# Patient Record
Sex: Female | Born: 1955 | Race: Black or African American | Hispanic: No | Marital: Single
Health system: Southern US, Community
[De-identification: ages and names within clinical notes are randomized; demographics above are authoritative.]

---

## 2012-09-25 ENCOUNTER — Emergency Department: Payer: Self-pay | Admitting: Emergency Medicine

## 2012-09-26 ENCOUNTER — Emergency Department: Payer: Self-pay | Admitting: Emergency Medicine

## 2012-09-26 LAB — CBC
HCT: 34 % — ABNORMAL LOW (ref 35.0–47.0)
MCH: 27.8 pg (ref 26.0–34.0)
MCHC: 32.9 g/dL (ref 32.0–36.0)
Platelet: 230 10*3/uL (ref 150–440)

## 2012-09-26 LAB — COMPREHENSIVE METABOLIC PANEL
Alkaline Phosphatase: 55 U/L (ref 50–136)
BUN: 14 mg/dL (ref 7–18)
Bilirubin,Total: 0.3 mg/dL (ref 0.2–1.0)
Chloride: 100 mmol/L (ref 98–107)
Co2: 24 mmol/L (ref 21–32)
Creatinine: 0.81 mg/dL (ref 0.60–1.30)
EGFR (African American): >60
EGFR (Non-African Amer.): >60
Glucose: 105 mg/dL — ABNORMAL HIGH (ref 65–99)
Potassium: 4.8 mmol/L (ref 3.5–5.1)
SGPT (ALT): 17 U/L (ref 12–78)
Sodium: 132 mmol/L — ABNORMAL LOW (ref 136–145)

## 2012-11-03 ENCOUNTER — Emergency Department: Payer: Self-pay | Admitting: Internal Medicine

## 2012-11-03 LAB — URINALYSIS, COMPLETE
Bacteria: NONE SEEN
Blood: NEGATIVE
Ketone: NEGATIVE
Leukocyte Esterase: NEGATIVE
Nitrite: NEGATIVE
RBC,UR: NONE SEEN /HPF (ref 0–5)
Specific Gravity: 1.021 (ref 1.003–1.030)
WBC UR: <1 /HPF (ref 0–5)

## 2012-11-30 ENCOUNTER — Emergency Department: Payer: Self-pay | Admitting: Emergency Medicine

## 2012-11-30 LAB — COMPREHENSIVE METABOLIC PANEL
Albumin: 3.4 g/dL (ref 3.4–5.0)
Alkaline Phosphatase: 53 U/L (ref 50–136)
Bilirubin,Total: 0.2 mg/dL (ref 0.2–1.0)
Calcium, Total: 8.7 mg/dL (ref 8.5–10.1)
Co2: 28 mmol/L (ref 21–32)
EGFR (Non-African Amer.): >60
Potassium: 2.9 mmol/L — ABNORMAL LOW (ref 3.5–5.1)
SGOT(AST): 22 U/L (ref 15–37)
Total Protein: 7.4 g/dL (ref 6.4–8.2)

## 2012-11-30 LAB — CBC
HCT: 30 % — ABNORMAL LOW (ref 35.0–47.0)
MCH: 27.7 pg (ref 26.0–34.0)
MCV: 85 fL (ref 80–100)
Platelet: 127 10*3/uL — ABNORMAL LOW (ref 150–440)
RBC: 3.53 10*6/uL — ABNORMAL LOW (ref 3.80–5.20)
RDW: 14.4 % (ref 11.5–14.5)
WBC: 2 10*3/uL — CL (ref 3.6–11.0)

## 2012-12-05 ENCOUNTER — Emergency Department: Payer: Self-pay | Admitting: Emergency Medicine

## 2013-02-23 ENCOUNTER — Emergency Department: Payer: Self-pay | Admitting: Emergency Medicine

## 2013-05-14 ENCOUNTER — Inpatient Hospital Stay: Payer: Self-pay | Admitting: Internal Medicine

## 2013-05-14 LAB — BASIC METABOLIC PANEL
Anion Gap: 2 — ABNORMAL LOW (ref 7–16)
BUN: 16 mg/dL (ref 7–18)
CHLORIDE: 103 mmol/L (ref 98–107)
Calcium, Total: 9 mg/dL (ref 8.5–10.1)
Co2: 32 mmol/L (ref 21–32)
Creatinine: 0.55 mg/dL — ABNORMAL LOW (ref 0.60–1.30)
EGFR (African American): >60
Glucose: 87 mg/dL (ref 65–99)
Osmolality: 274 (ref 275–301)
POTASSIUM: 4.1 mmol/L (ref 3.5–5.1)
SODIUM: 137 mmol/L (ref 136–145)

## 2013-05-14 LAB — D-DIMER(ARMC): D-Dimer: 971 ng/ml

## 2013-05-14 LAB — URINALYSIS, COMPLETE
BLOOD: NEGATIVE
Bacteria: NONE SEEN
Bilirubin,UR: NEGATIVE
GLUCOSE, UR: NEGATIVE mg/dL (ref 0–75)
KETONE: NEGATIVE
NITRITE: NEGATIVE
PH: 6 (ref 4.5–8.0)
PROTEIN: NEGATIVE
RBC,UR: NONE SEEN /HPF (ref 0–5)
Specific Gravity: 1.019 (ref 1.003–1.030)
Squamous Epithelial: 2
WBC UR: 4 /HPF (ref 0–5)

## 2013-05-14 LAB — CBC WITH DIFFERENTIAL/PLATELET
BASOS ABS: 0 10*3/uL (ref 0.0–0.1)
Basophil %: 0.2 %
EOS ABS: 0.1 10*3/uL (ref 0.0–0.7)
Eosinophil %: 2.2 %
HCT: 38.4 % (ref 35.0–47.0)
HGB: 11.8 g/dL — AB (ref 12.0–16.0)
LYMPHS ABS: 1 10*3/uL (ref 1.0–3.6)
Lymphocyte %: 20.6 %
MCH: 26.6 pg (ref 26.0–34.0)
MCHC: 30.8 g/dL — ABNORMAL LOW (ref 32.0–36.0)
MCV: 86 fL (ref 80–100)
MONO ABS: 0.5 x10 3/mm (ref 0.2–0.9)
MONOS PCT: 10.7 %
NEUTROS PCT: 66.3 %
Neutrophil #: 3.3 10*3/uL (ref 1.4–6.5)
PLATELETS: 119 10*3/uL — AB (ref 150–440)
RBC: 4.45 10*6/uL (ref 3.80–5.20)
RDW: 14.8 % — ABNORMAL HIGH (ref 11.5–14.5)
WBC: 4.9 10*3/uL (ref 3.6–11.0)

## 2013-05-14 LAB — TROPONIN I
TROPONIN-I: 7.3 ng/mL — AB
TROPONIN-I: 7 ng/mL — AB
Troponin-I: 7.9 ng/mL — ABNORMAL HIGH

## 2013-05-14 LAB — CK-MB
CK-MB: 1.4 ng/mL (ref 0.5–3.6)
CK-MB: 1.2 ng/mL (ref 0.5–3.6)
CK-MB: 1.1 ng/mL (ref 0.5–3.6)

## 2013-05-14 LAB — APTT
Activated PTT: 32.5 secs (ref 23.6–35.9)
Activated PTT: 102.6 secs — ABNORMAL HIGH (ref 23.6–35.9)

## 2013-05-15 LAB — CBC WITH DIFFERENTIAL/PLATELET
BASOS PCT: 0.6 %
Basophil #: 0 10*3/uL (ref 0.0–0.1)
EOS PCT: 3.6 %
Eosinophil #: 0.1 10*3/uL (ref 0.0–0.7)
HCT: 33.8 % — AB (ref 35.0–47.0)
HGB: 10.7 g/dL — ABNORMAL LOW (ref 12.0–16.0)
LYMPHS ABS: 1.3 10*3/uL (ref 1.0–3.6)
LYMPHS PCT: 35.9 %
MCH: 27.2 pg (ref 26.0–34.0)
MCHC: 31.7 g/dL — ABNORMAL LOW (ref 32.0–36.0)
MCV: 86 fL (ref 80–100)
Monocyte #: 0.4 x10 3/mm (ref 0.2–0.9)
Monocyte %: 10.5 %
Neutrophil #: 1.8 10*3/uL (ref 1.4–6.5)
Neutrophil %: 49.4 %
PLATELETS: 118 10*3/uL — AB (ref 150–440)
RBC: 3.94 10*6/uL (ref 3.80–5.20)
RDW: 14.8 % — AB (ref 11.5–14.5)
WBC: 3.7 10*3/uL (ref 3.6–11.0)

## 2013-05-15 LAB — LIPID PANEL
Cholesterol: 135 mg/dL (ref 0–200)
HDL Cholesterol: 56 mg/dL (ref 40–60)
Ldl Cholesterol, Calc: 64 mg/dL (ref 0–100)
Triglycerides: 77 mg/dL (ref 0–200)
VLDL Cholesterol, Calc: 15 mg/dL (ref 5–40)

## 2013-05-15 LAB — BASIC METABOLIC PANEL
Anion Gap: 4 — ABNORMAL LOW (ref 7–16)
BUN: 10 mg/dL (ref 7–18)
CHLORIDE: 105 mmol/L (ref 98–107)
Calcium, Total: 8.8 mg/dL (ref 8.5–10.1)
Co2: 29 mmol/L (ref 21–32)
Creatinine: 0.6 mg/dL (ref 0.60–1.30)
EGFR (Non-African Amer.): >60
GLUCOSE: 83 mg/dL (ref 65–99)
OSMOLALITY: 274 (ref 275–301)
Potassium: 4 mmol/L (ref 3.5–5.1)
SODIUM: 138 mmol/L (ref 136–145)

## 2013-05-15 LAB — APTT: Activated PTT: 104.5 secs — ABNORMAL HIGH (ref 23.6–35.9)

## 2013-05-17 LAB — TROPONIN I: Troponin-I: 2.3 ng/mL — ABNORMAL HIGH

## 2013-05-17 LAB — PRO B NATRIURETIC PEPTIDE: B-Type Natriuretic Peptide: 192 pg/mL — ABNORMAL HIGH (ref 0–125)

## 2013-05-17 LAB — CBC
HCT: 35.8 % (ref 35.0–47.0)
HGB: 11.5 g/dL — ABNORMAL LOW (ref 12.0–16.0)
MCH: 27.6 pg (ref 26.0–34.0)
MCHC: 32.2 g/dL (ref 32.0–36.0)
MCV: 86 fL (ref 80–100)
Platelet: 114 10*3/uL — ABNORMAL LOW (ref 150–440)
RBC: 4.18 10*6/uL (ref 3.80–5.20)
RDW: 14.9 % — AB (ref 11.5–14.5)
WBC: 3.1 10*3/uL — ABNORMAL LOW (ref 3.6–11.0)

## 2013-05-17 LAB — BASIC METABOLIC PANEL
Anion Gap: 3 — ABNORMAL LOW (ref 7–16)
BUN: 9 mg/dL (ref 7–18)
Calcium, Total: 8.8 mg/dL (ref 8.5–10.1)
Chloride: 105 mmol/L (ref 98–107)
Co2: 28 mmol/L (ref 21–32)
Creatinine: 0.61 mg/dL (ref 0.60–1.30)
EGFR (African American): >60
Glucose: 102 mg/dL — ABNORMAL HIGH (ref 65–99)
Osmolality: 271 (ref 275–301)
POTASSIUM: 4.2 mmol/L (ref 3.5–5.1)
SODIUM: 136 mmol/L (ref 136–145)

## 2013-05-17 LAB — PROTIME-INR
INR: 0.9
Prothrombin Time: 12.3 secs (ref 11.5–14.7)

## 2013-05-17 LAB — CK-MB: CK-MB: 0.6 ng/mL (ref 0.5–3.6)

## 2013-05-18 ENCOUNTER — Observation Stay: Payer: Self-pay | Admitting: Internal Medicine

## 2013-05-18 LAB — BASIC METABOLIC PANEL
ANION GAP: 4 — AB (ref 7–16)
BUN: 9 mg/dL (ref 7–18)
Calcium, Total: 8.9 mg/dL (ref 8.5–10.1)
Chloride: 107 mmol/L (ref 98–107)
Co2: 27 mmol/L (ref 21–32)
Creatinine: 0.58 mg/dL — ABNORMAL LOW (ref 0.60–1.30)
EGFR (African American): >60
Glucose: 89 mg/dL (ref 65–99)
Osmolality: 274 (ref 275–301)
POTASSIUM: 4.2 mmol/L (ref 3.5–5.1)
Sodium: 138 mmol/L (ref 136–145)

## 2013-05-18 LAB — CBC WITH DIFFERENTIAL/PLATELET
Basophil #: 0 10*3/uL (ref 0.0–0.1)
Basophil %: 0.5 %
Eosinophil #: 0.1 10*3/uL (ref 0.0–0.7)
Eosinophil %: 2.3 %
HCT: 36.1 % (ref 35.0–47.0)
HGB: 11.5 g/dL — ABNORMAL LOW (ref 12.0–16.0)
LYMPHS ABS: 0.7 10*3/uL — AB (ref 1.0–3.6)
Lymphocyte %: 30.7 %
MCH: 27.2 pg (ref 26.0–34.0)
MCHC: 31.8 g/dL — AB (ref 32.0–36.0)
MCV: 86 fL (ref 80–100)
MONO ABS: 0.3 x10 3/mm (ref 0.2–0.9)
MONOS PCT: 13.5 %
Neutrophil #: 1.2 10*3/uL — ABNORMAL LOW (ref 1.4–6.5)
Neutrophil %: 53 %
PLATELETS: 109 10*3/uL — AB (ref 150–440)
RBC: 4.23 10*6/uL (ref 3.80–5.20)
RDW: 14.8 % — AB (ref 11.5–14.5)
WBC: 2.4 10*3/uL — ABNORMAL LOW (ref 3.6–11.0)

## 2013-05-18 LAB — MAGNESIUM: MAGNESIUM: 1.8 mg/dL

## 2013-05-18 LAB — LIPID PANEL
Cholesterol: 138 mg/dL (ref 0–200)
HDL Cholesterol: 54 mg/dL (ref 40–60)
LDL CHOLESTEROL, CALC: 72 mg/dL (ref 0–100)
Triglycerides: 59 mg/dL (ref 0–200)
VLDL Cholesterol, Calc: 12 mg/dL (ref 5–40)

## 2013-05-18 LAB — TROPONIN I
Troponin-I: 2.1 ng/mL — ABNORMAL HIGH
Troponin-I: 1.9 ng/mL — ABNORMAL HIGH

## 2013-05-18 LAB — CK-MB
CK-MB: 0.6 ng/mL (ref 0.5–3.6)
CK-MB: 0.9 ng/mL (ref 0.5–3.6)

## 2013-05-19 LAB — CBC WITH DIFFERENTIAL/PLATELET
BASOS PCT: 0.4 %
Basophil #: 0 10*3/uL (ref 0.0–0.1)
EOS ABS: 0.1 10*3/uL (ref 0.0–0.7)
EOS PCT: 2.5 %
HCT: 35.9 % (ref 35.0–47.0)
HGB: 11.4 g/dL — ABNORMAL LOW (ref 12.0–16.0)
LYMPHS ABS: 0.8 10*3/uL — AB (ref 1.0–3.6)
LYMPHS PCT: 29.9 %
MCH: 27.5 pg (ref 26.0–34.0)
MCHC: 31.6 g/dL — ABNORMAL LOW (ref 32.0–36.0)
MCV: 87 fL (ref 80–100)
Monocyte #: 0.3 x10 3/mm (ref 0.2–0.9)
Monocyte %: 12.7 %
NEUTROS PCT: 54.5 %
Neutrophil #: 1.4 10*3/uL (ref 1.4–6.5)
Platelet: 118 10*3/uL — ABNORMAL LOW (ref 150–440)
RBC: 4.14 10*6/uL (ref 3.80–5.20)
RDW: 15.1 % — AB (ref 11.5–14.5)
WBC: 2.6 10*3/uL — AB (ref 3.6–11.0)

## 2013-05-19 LAB — TSH: Thyroid Stimulating Horm: 0.677 u[IU]/mL

## 2014-05-19 NOTE — H&P (Signed)
PATIENT NAME:  Jessica Mccullough, Jessica Mccullough MR#:  [id number] DATE OF BIRTH:  [birth year]  DATE OF ADMISSION:  05/17/2013  PRIMARY CARE PHYSICIAN: Nonlocal.  REFERRING PHYSICIAN: Dr. Rolla PlateMcLauren.  PRIMARY CARDIOLOGIST: Dr. Juliann Paresallwood.   CHIEF COMPLAINT: Chest pain.   HISTORY OF PRESENT ILLNESS: The patient is a 59 year old female who was just admitted to the hospital on April 19 with a chief complaint of chest pain and dizziness, and was discharged on 05/16/2013 with a diagnosis of non-Q wave myocardial infarction, highly suspicious for Prinzmetal's or vasospastic angina. The patient had cardiac catheterization done during this admission, which was apparently normal. The patient was just discharged home on April 21, which was yesterday, but the patient is coming back with chest pain again. The patient is reporting that, at around 9:00 or 9:30 p.m., suddenly she started having left-sided chest pain, which was cramp-like, which was not going away. It lasted for approximately 10 to 15 minutes. This was associated with shortness of breath, dizziness, nausea and vomited once. The patient was diaphoretic at that time. The patient was unable to ambulate at the time because of the severe cramp-like sensation in the chest. The patient crawled on the floor, reached the door, and called her neighbor. Also, 911 was called by the patient. EMS had given her four baby aspirin. After treatment with baby aspirin, the patient's chest cramp is completely resolved. The patient's initial troponin in the Emergency Room was at 2.30, but on April 19 it was at 7.00. EKG has not revealed any ST depressions or ST elevations. The ER physician, Dr. Merlinda FrederickMcLaughlin, has called on-call cardiologist from [facility] and discussed the case with him. They have recommended to admit and observe the patient overnight, and cycle cardiac biomarkers. During my examination, the patient is resting comfortably. The chest tightness or cramp is gone, but just feeling sore.  Denies any nausea, vomiting, abdominal pain or diarrhea. No family members at bedside. The patient is reporting that she just got married on April 15, this month, but currently she is living alone, as husband is in the [place]. No other complaints.   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   PAST SURGICAL HISTORY: Cholecystectomy, right hip surgery, right knee surgery.  ALLERGIES: PENICILLIN.   PSYCHOSOCIAL HISTORY: Currently living alone. She just got married on April 15. [place] the [place].   FAMILY HISTORY: Negative for colon or breast cancer. Positive for diabetes mellitus and stroke.   HOME MEDICATIONS: Vitamin B12 1000 mcg 1 tablet p.o. once daily, metoprolol tartrate 25  mg 1 tablet 2 times a day, meloxicam 50 mg 1 tablet p.o. once daily, aspirin 81 mg once daily, albuterol 2.5 mg nebulizers treatment as needed for shortness of breath and wheezing, Advair Diskus 250/50, 1 puff inhalation 2 times a day.   REVIEW OF SYSTEMS:  CONSTITUTIONAL: Denies any fever or fatigue.  EYES: Denies blurry vision, double vision, inflammation, glaucoma.  ENT: Denies epistaxis, discharge, denies any ear pain.  RESPIRATORY: Denies cough, chronic obstructive pulmonary disease.  CARDIOVASCULAR: Complaining of chest pain, orthopnea. Denies any edema, erythema. GASTROINTESTINAL: Still complaining of nausea, but denies any vomiting or diarrhea. Denies any abdominal pain. No hematemesis. No melena GENITOURINARY: Deferred.  ENDOCRINE: Denies polyuria, nocturia, thyroid problems. Complaining of increased sweating.  HEMATOLOGIC AND LYMPHATIC:  No anemia, easy bruising, bleeding.  INTEGUMENTARY: No acne, rash, lesions. MUSCULOSKELETAL: No joint pain in the neck  or back. Denies any shoulder pain, denies arthritis.  NEUROLOGIC: Denies vertigo or ataxia. No dementia, headaches.  PSYCHIATRIC: No ADD, OCD, insomnia.   PHYSICAL EXAMINATION: VITAL SIGNS: Temperature 98.1, pulse 82, respirations 22, blood pressure is 169/96, pulse oximetry 100%.  GENERAL APPEARANCE: Not in any acute distress. Moderately built and nourished  HEENT: Normocephalic, atraumatic. Pupils are equally reactive to light and accommodation. No scleral icterus. No conjunctival injection. No sinus tenderness. No postnasal drip. Moist mucous membranes.  NECK: Supple. No JVD. No thyromegaly. Range of motion is intact.  LUNGS: Clear to auscultation bilaterally. No accessory muscle use, no anterior chest wall tenderness on palpation.  CARDIOVASCULAR: S1, S2 normal. Regular rate and rhythm. No murmurs.  GASTROINTESTINAL: Soft. Bowel sounds are positive in all four quadrants. Nontender, nondistended. No hepatosplenomegaly, no masses felt.  NEUROLOGIC: Awake, alert, oriented x3. Motor and sensory grossly intact. Reflexes are 2+.  SKIN: Warm to touch. No rashes. No lesions.  PSYCHIATRIC: Normal mood and affect.   LABORATORIES AND IMAGING STUDIES: The patient's CPK MB 0.6. Troponin 2.30. WBC 3.1, hemoglobin 11.5, hematocrit is 35.8, platelets are 115. Glucose 102. BNP 192. BUN and creatinine are normal. Sodium 136, potassium 4.2, chloride 105, CO2 28, GFR is less than 3. Serum osmolality 277, calcium 8.8. CT angiogram with contrast for pulmonary embolism revealed no demonstrable of pulmonary embolus, no thoracic aortic aneurysm or dissection. This study was done on April 19.  ASSESSMENT AND PLAN: A 59 year old female just discharged from the hospital on April 21 is coming back at this time with sudden onset of chest pain at around 9:00 p.m. last night.   1.  Chest pain is probably from Prinzmetal's angina or vasospastic angina. We will admit to telemetry under observation status. Cycle cardiac  biomarkers to see whether it is uptrending or not. Acute coronary syndrome protocol with oxygen, nitroglycerin, aspirin, beta blocker and statin. We will hold off on statin, as the patient's previous LDL was less than 100, and cardiology has not recommended any statin; will discontinue that.  2.  Hypertension. Blood pressures are okay. Will continue her home medications.  3.  Lupus. The patient is not on any steroids for the past 14 years. Continue close monitoring.  4.  Asthma. We will provide nebulizer treatments as needed. The patient was not leaving on any home oxygen.   Plan of care was discussed in detail with the patient. She verbalized understanding of the plan.  TOTAL TIME SPENT ON ADMISSION: 45 minutes.   CODE STATUS: She is full code.    ____________________________ Ramonita Lab, MD ag:cg D: 05/18/2013 00:20:22 ET T: 05/18/2013 01:08:26 ET JOB#: [id number]  cc: Ramonita Lab, MD, <Dictator> Ramonita Lab MD ELECTRONICALLY SIGNED 05/31/2013 4:14

## 2014-05-19 NOTE — Consult Note (Signed)
Brief Consult Note: Diagnosis: NQMI CAD [place].   Recommend to proceed with surgery or procedure.   Recommend further assessment or treatment.   Orders entered.   Discussed with Attending MD.   Comments: IMP NQMI CAD [place] HTN Hyperlipidemia Hx PCI . PLAN If CT chest neg for PErec cardiac cath F/U troponins ECHO asa b-blockers Hydration Anticouguation.  Electronic Signatures: Dorothyann Pengallwood, Dwayne D (MD)  (Signed 20-Apr-15 07:09)  Authored: Brief Consult Note   Last Updated: 20-Apr-15 07:09 by Alwyn Peaallwood, Dwayne D (MD)

## 2014-05-19 NOTE — H&P (Signed)
PATIENT NAME:  Jessica Mccullough, BOSSERMAN MR#:  [id number] DATE OF BIRTH:  [birth year]  DATE OF ADMISSION:  05/14/2013  REFERRING PHYSICIAN: Dr. Scotty Court  FAMILY PHYSICIAN: [facility]  REASON FOR ADMISSION: Chest pain, with elevated troponin, consistent with non-ST elevation MI.   HISTORY OF PRESENT ILLNESS: The patient is a 59 year old female with a history of coronary artery disease, status post PTCA x 2. Also has a history of systemic lupus erythematosus and hypertension. Presents to the Emergency Room after recently traveling to [place], with chest pain and dizziness. In the Emergency Room, the patient's EKG showed no acute changes, but troponin returned abnormal at 7.9, consistent with an acute MI. She was started on IV heparin, and is now admitted for further evaluation. She denies any chest pain at the present time.   PAST MEDICAL HISTORY: 1.  ASCVD, status post PTCA x 2.  2.  Benign hypertension.  3.  History of lupus.  4.  Migraine headaches.  5.  Asthma.  6.  Status post cholecystectomy.  7.  Status post right hip surgery.  8.  Status post right knee surgery.   MEDICATIONS: 1.  B12, 1000 mcg p.o. daily.  2.  Norco 5/325, 1 to 2 p.o. q. 6 hours p.r.n. pain.  3.  Aspirin 81 mg p.o. daily.  4.  Albuterol SVN q. 4 hours p.r.n. shortness of breath.  5.  Mobic 15 mg p.o. daily.  6.  Fioricet 1 to 2 p.o. q. 6 hours as needed for headaches.  7.  Estrogen supplement daily.   ALLERGIES: PENICILLIN.   SOCIAL HISTORY: The patient denies alcohol or tobacco abuse.   FAMILY HISTORY: Positive for coronary artery disease, diabetes, stroke. Negative for colon or breast cancer.   REVIEW OF SYSTEMS:   CONSTITUTIONAL: No fever or change in weight.  EYES: No blurred or double vision. No glaucoma.  ENT: No tinnitus or hearing loss. No nasal discharge or bleeding. No difficulty swallowing.  RESPIRATORY: No cough or wheezing. Denies hemoptysis.  CARDIOVASCULAR: No orthopnea or palpitations.  No syncope.  GASTROINTESTINAL: No nausea, vomiting, or diarrhea. No abdominal pain or change in bowel habits.  GENITOURINARY: No dysuria or hematuria. No incontinence.  ENDOCRINE: No polyuria or polydipsia. No heat or cold intolerance.  HEMATOLOGIC: The patient denies anemia, easy bruising, or bleeding.  LYMPHATIC: No swollen glands.  MUSCULOSKELETAL: The patient denies pain in her neck, back, shoulders, knees, or hips. No gout.  NEUROLOGIC: No numbness. Denies stroke or seizures.  PSYCHIATRIC:   The patient denies anxiety, insomnia, or depression.   PHYSICAL EXAMINATION: GENERAL: The patient is in no acute distress.  VITAL SIGNS: Remarkable for a blood pressure of 129/73, heart rate 85, respiratory rate of 18, temperature 98.1, sat 100% on room air.  HEENT: Normocephalic, atraumatic. Pupils equal, round, reactive to light and accommodation. Extraocular movements are intact. Sclerae are not icteric. Conjunctivae are clear. Oropharynx is clear.  NECK: Supple, without JVD. No adenopathy or thyromegaly is noted.  LUNGS: Clear to auscultation and percussion, without wheezes, rales, or rhonchi. No dullness. Respiratory effort is normal.  CARDIAC EXAM: Regular rate and rhythm, with a normal S1, S2. No significant rubs, murmurs, or gallops. PMI is nondisplaced. Chest wall is nontender.  ABDOMEN: Soft, nontender, with normoactive bowel sounds. No organomegaly or masses were appreciated. No hernias or bruits were noted.  EXTREMITIES: Without clubbing, cyanosis, edema. Pulses were 2+ bilaterally.  SKIN:  Warm and dry, without rash or lesions.  NEUROLOGIC EXAM: Revealed cranial nerves II through  XII grossly intact. Deep tendon reflexes were symmetric. Motor and sensory exams nonfocal.  PSYCHIATRIC EXAM: Revealed a patient who is alert and oriented to person, place, and time. She was cooperative and used good judgment.   LABORATORY DATA: EKG revealed sinus rhythm at 91 beats per minute, with a  questionable old septal MI. Chest x-ray was unremarkable. D-dimer was elevated at 971. Urinalysis negative. White count 4.9, with a hemoglobin of 11.8. Glucose was 87, with a BUN of 16 and a creatinine of 0.55, with a GFR of greater than 60. Her troponin was 7.9.   ASSESSMENT: 1.  Chest pain, with elevated troponin, worrisome for non-ST-elevation myocardial infarction.  2. History of atherosclerotic cardiovascular disease, status post percutaneous transluminal coronary angioplasty x 2.  3.  Benign hypertension.  4.  Anemia of chronic disease. 5.  Lupus.  6.  Migraine headaches.  7.  Asthma.  8.  Elevated D-dimer.   PLAN: The patient will be sent for a CT of the chest to rule out PE, given her chest pain and elevated D-dimer. She will be started on IV heparin, nitro paste, and beta blocker therapy. Will follow serial cardiac enzymes and obtain an echocardiogram. Will consult Cardiology. Check a lipid profile with routine labs in the morning. Supplement oxygen as needed. Clear liquid diet for now, as the patient may go for cardiac catheterization tomorrow. Further treatment and evaluation will depend upon the patient's progress.   Total time spent on this patient was 50 minutes.     ____________________________ Duane LopeJeffrey D. Judithann SheenSparks, MD jds:mr D: 05/14/2013 16:55:44 ET T: 05/14/2013 17:37:31 ET JOB#: [id number]  cc: Duane LopeJeffrey D. Judithann SheenSparks, MD, <Dictator> Tatelyn Vanhecke Rodena Medin Zaydenn Balaguer MD ELECTRONICALLY SIGNED 05/14/2013 18:27

## 2014-05-19 NOTE — Discharge Summary (Signed)
PATIENT NAME:  Jessica Mccullough, Jessica Mccullough MR#:  [id number] DATE OF BIRTH:  1955-04-16  DATE OF ADMISSION:  05/18/2013 DATE OF DISCHARGE:  05/19/2013  ADMITTING DIAGNOSIS: Chest pain.   DISCHARGE DIAGNOSES:   DICTATION ENDS HERE <<MISSING TEXT>>   ____________________________ Katharina Caperima Teri Diltz, MD rv:lt D: 05/21/2013 17:03:12 ET T: 05/22/2013 05:40:01 ET JOB#: [id number]  cc: Katharina Caperima Shallon Yaklin, MD, <Dictator>

## 2014-05-19 NOTE — Discharge Summary (Signed)
PATIENT NAME:  Jessica BimlerMILES, Jessica Mccullough MR#:  [id number] DATE OF BIRTH:  27-Apr-1955  DATE OF ADMISSION:  05/18/2013 DATE OF DISCHARGE:  05/19/2013  ADMITTING DIAGNOSIS: Chest pain.   DISCHARGE DIAGNOSES: 1. Chest pain. 2. 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    3. History of lupus arthritis. 4. Unintended weight loss of approximately 6 pounds in 1 year. Gastrointestinal workup is pending, should be performed as outpatient. 5. History of asthma and migraine headaches.  6. Hypertension.  7. Recent myocardial infarction as mentioned above.   DISCHARGE CONDITION: Stable.   DISCHARGE MEDICATIONS: The patient is to resume vitamin B12 at 1000 mcg p.o. daily, albuterol 1 vial inhalation twice daily as needed, meloxicam 15 mg p.o. daily, Advair Diskus 250/50 1 puff twice daily, aspirin 81 mg p.o. daily, metoprolol tartrate 25 mg p.o. twice daily, nitroglycerin 0.4 mg sublingually every 5 minutes as needed, amlodipine 5 mg p.o. daily, atorvastatin 40 mg p.o. at bedtime, omeprazole 40 mg p.o. daily.   HOME OXYGEN: None.   DIET: Low salt, low cholesterol, low fat.    FOLLOWUP APPOINTMENT: With Dr. Lady GaryFath in 1 week after discharge, Dr. Bluford Kaufmannh at [facility] 2 days after discharge, and Dr. Chilton SiGreen primary care physician in 2 days after discharge.    CONSULTANTS: Dr. Lady GaryFath, Dr. Bluford Kaufmannh, care management, social work.   RADIOLOGIC STUDIES: None.   HISTORY OF PRESENT ILLNESS:  The patient is a Jessica Mccullough with a past medical history significant for history of acute non-Q-wave MI who presented to the hospital just recently and had catheterization, which showed normal coronary arteries, comes back to the hospital on the 22nd of April 2015 with complaints of recurrent chest pains. Please refer to Dr. Rob HickmanGouru's   admission note on the 22nd of April 2015.   HOSPITAL COURSE:  On arrival to the hospital, the patient's, vital signs, temperature was 98.1, pulse was 82, respiration rate was 20, blood pressure 169/96, saturation 100% on room air. Physical exam was unremarkable.   The patient's lab data done in the Emergency Room showed a glucose of 102, otherwise BMP was unremarkable. Beta-type natriuretic peptide was 192, which is minimally elevated. The patient's troponin was 2.3 on the first set, 2.1 on the 2nd, then 1.9 on the 3rd. TSH was normal at 0.277. White blood cell count was low at 3.1, hemoglobin was 11.5, platelet count was 114. Coagulation panel was unremarkable. EKG done on the 27th of February 2015 showed normal sinus rhythm at 81 beats per minute, minimal voltage criteria for LVH, anterior infarct, age indeterminate, and no acute ST-T changes were noted. The patient was admitted to the hospital for further evaluation. She was started again on medications such as heparin subcutaneous and nitroglycerin, as well as beta blocker, as well as aspirin, and consultation with Dr. Lady GaryFath was obtained. Dr. Lady GaryFath felt that she is coming in now with similar pains and her troponin is elevated; however, trending down consistent with natural resolution of elevated level from prior admission. EKG was unremarkable. He felt that the patient's presentation could be vasospastic angina. He recommended to add amlodipine at 5 mg p.o. daily dose and needs to be evaluated, and recommended also evaluation for weight loss. The patient was consulted by gastroenterologist, Ms. Owens Sharkawn Harrison, and recommended followup as outpatient basis to include CT scans as well as endoscopic evaluations.  Also, he recommended  to get TSH done on blood work to make sure the patient does not have any underlying thyroid condition, which could present with unexplained weight loss. The patient's TSH was checked and was found to be normal.  It was felt the patient  would benefit from evaluation as outpatient by gastroenterologist. She is being discharged in stable condition with the above-mentioned medications and followup. On the day of discharge, the patient's temperature was 98.2, pulse was 81, respiratory rate was 18, blood pressure 103/73,  saturation was 97% to 100% on room air at rest.   Of note, the patient's liver enzymes were unremarkable, as well as her cholesterol level is showing LDL of 72,  triglycerides of 59, and HDL of 54, but normally since the patient had a non-Q-wave MI, we felt the patient would benefit from cholesterol medication initiation. For this reason, she was initiated on Lipitor. In regards to weight loss, we started her on proton pump inhibitor. 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    TIME SPENT: Forty minutes on this patient.   ____________________________ Katharina Caperima Fouad Taul, MD rv:sg D: 05/21/2013 17:21:00 ET T: 05/22/2013 07:06:16 ET JOB#: [id number]  cc: Iantha FallenKenneth A. Lady GaryFath, MD Ezzard StandingPaul Y. Bluford Kaufmannh, MD Katharina Caperima Elisama Thissen, MD, <Dictator> Dr. Chilton SiGreen

## 2014-05-19 NOTE — Consult Note (Signed)
PATIENT NAME:  Jessica Mccullough, Jessica Mccullough MR#:  [id number] DATE OF BIRTH:  1955/04/10  DATE OF CONSULTATION:  05/19/2013  REFERRING PHYSICIAN:  Katharina Caper, MD CONSULTING PHYSICIAN:  Lutricia Feil, MD / Rodman Key, NP  REASON FOR CONSULTATION: Weight loss, unintentional.   PRIMARY CARE PHYSICIAN: None.  PRIMARY CARDIOLOGIST: Dr. Dorothyann Peng.  HISTORY OF PRESENT ILLNESS: Ms. Titsworth is a 59 year old African American female who was just recently admitted on April 19th with the chief complaint of chest pain and dizziness. She was discharged on 05/16/2013 and diagnosed with a non-Q-wave myocardial infarction highly suspicious for Prinzmetal's vasospastic angina. The patient had catheterization done during this admission, which was apparently normal. She was discharged again on April 21st and then on April 22nd she started to experience chest pain again, reported it around 9:00 to 9:30 that evening, left-sided chest discomfort, describes like a cramp that would not go away. It lasted for 10 to 15 minutes. It was associated with shortness of breath, dizziness, nausea, and vomited once. The patient did become diaphoretic. She was unable to ambulate. She crawled on the floor, called 911. EMS gave her 4 aspirin and her pain resolved and has remained resolved since being admitted. EKG did not reveal any ST depressions or ST elevations. Thus, she was admitted for chest pain.   The patient is being asked to be seen by GI service for the concern of unintentional 60 pound weight loss since April of last year. The patient states that she moved here in May of last year and gradually over time she has continued to lose weight with unknown etiological reasoning.  She states she has a good appetite. She has been eating normal. No nausea, no vomiting, no reflux, and no abdominal pain. The patient states she has not even been exercising. In fact, she states life is very good. She just got married on the 15th of this month. Bowels  are moving on average every day. No rectal bleeding. No melena. No dysphagia. The patient has not had any form of gynecological evaluation in the past 2 years, as far as a Pap smear, and has not a mammogram in over 5 years. She does state that she has had a colonoscopy during her lifetime which was over 10 years ago and was unremarkable according to her. This was done while she was residing in [place], [place]. Has not ever had an upper endoscopy performed in the past.   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   PAST SURGICAL HISTORY: Cholecystectomy, total right hip replacement in her early 41s to 30s due to steroid-induced bone loss causing her to be high risk for fractures, total right knee replacement in 20s to 30s, reasoning as well for steroid-induced.   ALLERGIES: PENICILLIN.   HOME MEDICATIONS: Vitamins B12 1000 mcg once daily, metoprolol tartrate 25 mg one 2 times a day, meloxicam 15 mg once a day, aspirin 81 mg a day, albuterol 2.5, nebulizer treatments as needed for shortness of breath, wheezing, and Advair Diskus 250/50 one puff twice a day.   FAMILY HISTORY: Grandmother, maternal, history of cancer, unknown type. Uncle, maternal, cancer, again unknown type. No documented known colon cancer or breast cancer. Family history is significant for diabetes mellitus and stroke.   SOCIAL HISTORY: No tobacco use since 1980. No alcohol since 1980. Only social use at that time and no recreational drug use. Currently  residing by herself. Got married April 15th. [place] the [place] from [place]. Does have children.   REVIEW OF SYSTEMS: All 10 systems reviewed and checked, otherwise unremarkable other than what is stated above.   PHYSICAL EXAMINATION: VITAL SIGNS: Temperature 97.4, pulse 60,  respirations 19, blood pressure 128/88, and pulse ox 100% on room air.  GENERAL: Well developed, well nourished [age] year old [place] female, no acute distress noted, pleasant.  HEENT: Normocephalic, atraumatic. Pupils equal and reactive to light. Conjunctivae clear. Sclerae anicteric.  NECK: Supple. Trachea midline. No lymphadenopathy or thyromegaly.  PULMONARY: Symmetric rise and fall of chest. Clear to auscultation throughout.  CARDIOVASCULAR: Regular rate and rhythm, S1. No murmurs. No gallops.  ABDOMEN: Soft, nondistended. Bowel sounds in 4 quadrants. No bruits. No masses. No evidence of hepatosplenomegaly.  RECTAL: Deferred.  MUSCULOSKELETAL: Moving all 4 extremities. No contractures. No clubbing.  EXTREMITIES: No edema.  PSYCHIATRIC: Alert and oriented x4. Memory grossly intact. Appropriate affect and mood.  NEUROLOGIC: No gross neurological deficits.   DIAGNOSTIC DATA: All laboratory studies during both admissions from April 19th to today's date reviewed by myself as well as imaging studies.   IMPRESSION:  1.  Unintentional 60 pound weight loss over the past year. 2.  Myocardial infarction, May 14, 2013.  3.  Known history of chronic obstructive pulmonary disease. 4.  History of lupus.   PLAN: The patient's presentation was discussed with Dr. Lutricia FeilPaul Oh. Do agree the patient is able to follow up and have outpatient evaluation for this unintentional weight loss. At that time, do recommend that she have a CT scan of the chest, abdomen, and pelvis as well is to proceed with endoscopic evaluation inclusive of an esophagogastroduodenoscopy as well as a colonoscopy. Given her recent coronary event, will need to await 4 weeks at least prior to being able to safely proceed with endoscopic evaluation. This was discussed with the patient. Will order thyroid panel although to be done on the blood work, which she has already had during her hospitalization to at least rule out there is not an  underlying thyroid condition causing this unintentional weight loss.   Do recommend the patient be established with [facility] care doctor [facility], which is what the patient is requesting. The patient does warrant gynecological evaluation inclusive of a Pap smear as well as her mammogram being ordered as this has not recently been done as previously stated in my note.   These services provided by Rodman Keyawn S. Harrison, MS, APRN, [facility], FNP under collaborative agreement with Lutricia FeilPaul Oh, MD.   ____________________________ Rodman Keyawn S. Harrison, NP dsh:sb D: 05/19/2013 10:49:12 ET T: 05/19/2013 11:08:27 ET JOB#: [id number]  cc: Rodman Keyawn S. Harrison, NP, <Dictator> Rodman KeyAWN S HARRISON MD ELECTRONICALLY SIGNED 05/25/2013 7:12

## 2014-05-19 NOTE — Discharge Summary (Signed)
PATIENT NAME:  Jessica BimlerMILES, Kania MR#:  [id number] DATE OF BIRTH:  07-17-1955  DATE OF ADMISSION:  05/14/2013 DATE OF DISCHARGE:  05/16/2013  PRESENTING COMPLAINT: Chest pain, dizziness.   DISCHARGE DIAGNOSES:  1. Acute non-Q-wave myocardial infarction, suspect transient angina/Prinzmetal/vasospastic angina. The patient's cardiac catheterization was normal.  2. Lupus arthritis.   CONDITION ON DISCHARGE: Fair.   MEDICATIONS:  1. Vitamin B12 1000 mcg p.o. daily.  2. Albuterol nebulizer 2 times a day as needed.  3. Meloxicam 15 mg p.o. daily.  4. Advair Diskus 250/50 one puff b.i.d.  5. Aspirin 81 mg daily.  6. Metoprolol 25 mg b.i.d.   FOLLOWUP: With Dr. Juliann Paresallwood in 1 to 2 weeks.   CARDIOLOGY CONSULTATION: Dwayne D. Callwood, MD  PROCEDURES:  1. Cardiac catheterization showed normal coronaries.  2. Echocardiogram showed EF of 50% to 55%. No wall motion abnormality was noted.   LABORATORY DATA: CBC within normal limits except H and H of 10.7 and 33.8. Platelet count was 118. CK and CK-MB within normal limits. Troponin was 7, 7.30, 7.90. D-dimer 971.   BRIEF SUMMARY OF HOSPITAL COURSE: Jessica BimlerFrances Grego is a 59 year old African-American female with history of lupus arthritis, who came in with history of CAD status post PTCA x2 about a year ago, who comes in with:   1. Acute non-Q-wave myocardial infarction. The patient was admitted, started on heparin drip. Aspirin, beta blockers and nitroglycerin were started also. The patient also received statins; however, her LDL was less than 100, and since her coronaries were normal on cardiac catheterization, statins were discontinued. The patient was continued on beta blockers and aspirin. She was seen by Dr. Juliann Paresallwood, who performed the cardiac catheterization. 2. Lupus arthritis, on Mobic.  3. History of asthma. Continued nebulizers.  4. Hospital stay otherwise remained stable.   CODE STATUS: The patient remained a full code.   TIME SPENT: 40  minutes.   ____________________________ Wylie HailSona A. Allena KatzPatel, MD sap:lb D: 05/17/2013 06:48:10 ET T: 05/17/2013 07:00:06 ET JOB#: [id number]  cc: Dianna Ewald A. Allena KatzPatel, MD, <Dictator> Dwayne D. Juliann Paresallwood, MD Willow OraSONA A Jaselyn Nahm MD ELECTRONICALLY SIGNED 05/22/2013 9:13

## 2014-05-19 NOTE — Consult Note (Signed)
Brief Consult Note: Diagnosis: Pt with recent admission for chest pain and elevated serum troponin to 7.9. Cardiac catheterization did not reveal any significant cad. Now back with similar pain. troponin elevated but trending down consistant with natural resolution of elevated level from previious admission.   Patient was seen by consultant.   Recommend further assessment or treatment.   Comments: Pt with history of chest pian. Had severe pain while in [place] earlier. Did not sekk attention. Presented earlier this week with chest pian and abnormal troponin. Tamala Fothergillcath revealed preerved lv funciton and no signficant cad. Troponin was 7.9 Pain reoccurred. EKG unremarkable. Troponin 2.5 consistnat with natural regression from previous admission. EKG unremarkable. Chest ct did not reveal any pulmonary embolus or disection. Pt also state she has lost approximately 60 lbs in the last several months with no change in her diet and not attempting to lose weight. Will need to evaluate etiology of weight loss. Concerned over possible vasospastic angina. Will add amlodipine at 5 mg daily. WIll need to evaluate abdomen for possible etiology of weight loss.  Electronic Signatures: Dalia HeadingFath, Haig Gerardo A (MD)  (Signed 23-Apr-15 13:20)  Authored: Brief Consult Note   Last Updated: 23-Apr-15 13:20 by Dalia HeadingFath, Megahn Killings A (MD)

## 2014-05-19 NOTE — Consult Note (Signed)
Brief Consult Note: Diagnosis: Unintentional weight loss.  Recent MI.  COPD, Lupus.   Consult note dictated.   Discussed with Attending MD.   Comments: Patient's presentation discussed with Dr. Lutricia FeilPaul Oh.  Recommend for patient to follow-up outpatient basis inclusive of CT scans and endoscopic evaluation. Will proceed with laboratory study of thyroid panel being done on bloodwork done yesterday to make sure underlying thyroid condition is not present and to explain unexplained weight loss.  Electronic Signatures: Rodman KeyHarrison, Dawn S (NP)  (Signed 24-Apr-15 10:52)  Authored: Brief Consult Note   Last Updated: 24-Apr-15 10:52 by Rodman KeyHarrison, Dawn S (NP)

## 2014-05-19 NOTE — Discharge Summary (Signed)
PATIENT NAME:  Jessica BimlerMILES, Jessica Mccullough MR#:  [id number] DATE OF BIRTH:  27-Apr-1955  DATE OF ADMISSION:  05/18/2013 DATE OF DISCHARGE:  05/19/2013  ADMITTING DIAGNOSIS: Chest pain.   DISCHARGE DIAGNOSES: 1. Chest pain. 2. 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    3. History of lupus arthritis. 4. Unintended weight loss of approximately 6 pounds in 1 year. Gastrointestinal workup is pending, should be performed as outpatient. 5. History of asthma and migraine headaches.  6. Hypertension.  7. Recent myocardial infarction as mentioned above.   DISCHARGE CONDITION: Stable.   DISCHARGE MEDICATIONS: The patient is to resume vitamin B12 at 1000 mcg p.o. daily, albuterol 1 vial inhalation twice daily as needed, meloxicam 15 mg p.o. daily, Advair Diskus 250/50 1 puff twice daily, aspirin 81 mg p.o. daily, metoprolol tartrate 25 mg p.o. twice daily, nitroglycerin 0.4 mg sublingually every 5 minutes as needed, amlodipine 5 mg p.o. daily, atorvastatin 40 mg p.o. at bedtime, omeprazole 40 mg p.o. daily.   HOME OXYGEN: None.   DIET: Low salt, low cholesterol, low fat.    FOLLOWUP APPOINTMENT: With Dr. Lady GaryFath in 1 week after discharge, [facility] Bluford [facility]auf[facility]h [facility] 2 days after discharge, and Dr. Chilton SiGreen primary care physician in 2 days after discharge.    CONSULTANTS: Dr. Lady GaryFath, Dr. Bluford Kaufmannh, care management, social work.   RADIOLOGIC STUDIES: None.   HISTORY OF PRESENT ILLNESS:  The patient is a Jessica Mccullough with a past medical history significant for history of acute non-Q-wave MI who presented to the hospital just recently and had catheterization, which showed normal coronary arteries, comes back to the hospital on the 22nd of April 2015 with complaints of recurrent chest pains. Please refer to Dr. Rob HickmanGouru's   admission note on the 22nd of April 2015.   HOSPITAL COURSE:  On arrival to the hospital, the patient's, vital signs, temperature was 98.1, pulse was 82, respiration rate was 20, blood pressure 169/96, saturation 100% on room air. Physical exam was unremarkable.   The patient's lab data done in the Emergency Room showed a glucose of 102, otherwise BMP was unremarkable. Beta-type natriuretic peptide was 192, which is minimally elevated. The patient's troponin was 2.3 on the first set, 2.1 on the 2nd, then 1.9 on the 3rd. TSH was normal at 0.277. White blood cell count was low at 3.1, hemoglobin was 11.5, platelet count was 114. Coagulation panel was unremarkable. EKG done on the 27th of February 2015 showed normal sinus rhythm at 81 beats per minute, minimal voltage criteria for LVH, anterior infarct, age indeterminate, and no acute ST-T changes were noted. The patient was admitted to the hospital for further evaluation. She was started again on medications such as heparin subcutaneous and nitroglycerin, as well as beta blocker, as well as aspirin, and consultation with Dr. Lady GaryFath was obtained. Dr. Lady GaryFath felt that she is coming in now with similar pains and her troponin is elevated; however, trending down consistent with natural resolution of elevated level from prior admission. EKG was unremarkable. He felt that the patient's presentation could be vasospastic angina. He recommended to add amlodipine at 5 mg p.o. daily dose and needs to be evaluated, and recommended also evaluation for weight loss. The patient was consulted by gastroenterologist, Ms. Owens Sharkawn Harrison, and recommended followup as outpatient basis to include CT scans as well as endoscopic evaluations.  Also, he recommended  to get TSH done on blood work to make sure the patient does not have any underlying thyroid condition, which could present with unexplained weight loss. The patient's TSH was checked and was found to be normal.  It was felt the patient  would benefit from evaluation as outpatient by gastroenterologist. She is being discharged in stable condition with the above-mentioned medications and followup. On the day of discharge, the patient's temperature was 98.2, pulse was 81, respiratory rate was 18, blood pressure 103/73,  saturation was 97% to 100% on room air at rest.   Of note, the patient's liver enzymes were unremarkable, as well as her cholesterol level is showing LDL of 72,  triglycerides of 59, and HDL of 54, but normally since the patient had a non-Q-wave MI, we felt the patient would benefit from cholesterol medication initiation. For this reason, she was initiated on Lipitor. In regards to weight loss, we started her on proton pump inhibitor. 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    TIME SPENT: Forty minutes on this patient.   ____________________________ Katharina Caper, MD rv:sg D: 05/21/2013 17:21:00 ET T: 05/22/2013 07:06:16 ET JOB#: [id number]  cc: Iantha Fallen A. Lady Gary, MD Ezzard Standing. Bluford Kaufmann, MD Dr. Bettina Gavia MD ELECTRONICALLY SIGNED 05/27/2013 18:56

## 2014-09-26 IMAGING — CT CT ANGIO CHEST
2 of 6 series · 18 of 36 positions shown · IV contrast (isovue)
Comparison: Chest radiograph May 14, 2013

CLINICAL DATA: Shortness of Breath

EXAM:
CT ANGIOGRAPHY CHEST WITH CONTRAST
TECHNIQUE: Multidetector CT imaging of the chest was performed using the
standard protocol during bolus administration of intravenous
contrast. Multiplanar CT image reconstructions and MIPs were
obtained to evaluate the vascular anatomy.
CONTRAST:  75 mL Isovue 370 nonionic

[Series 6: pe 1.0 thins · axial · 0.67mm/px · z∈[-165,+46]mm · 17 of 239 slices shown]
[im 14/239  lung]
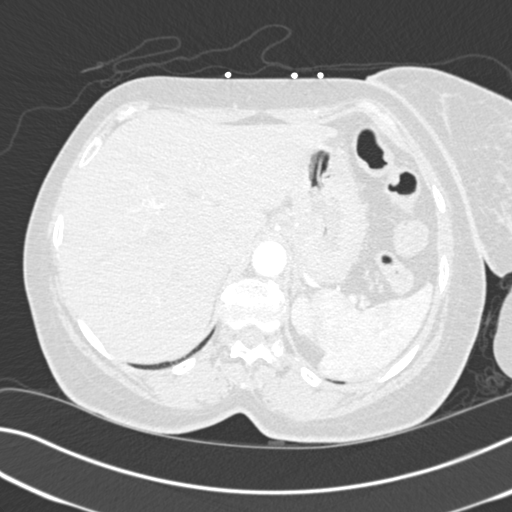
[im 27/239  mediastinal]
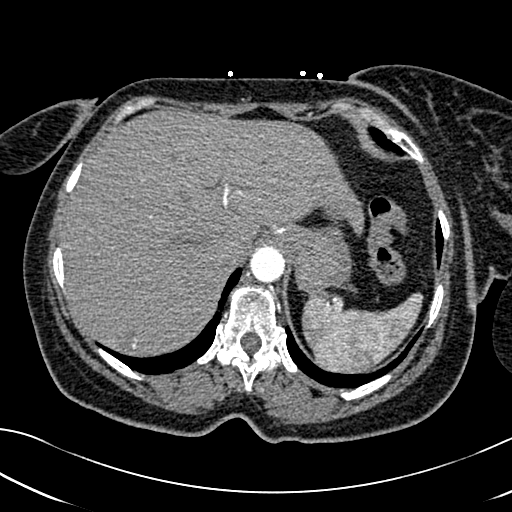
[im 40/239  lung]
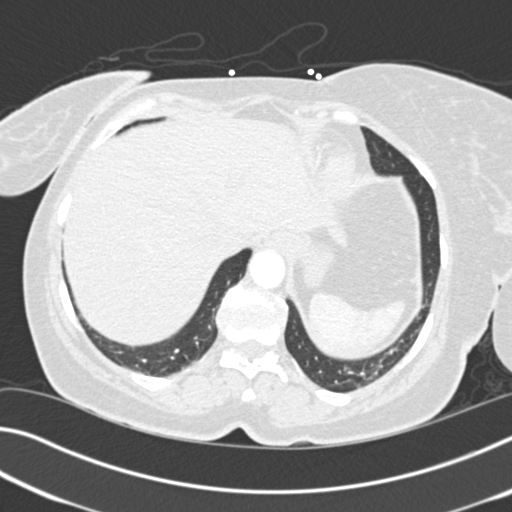
[im 53/239  mediastinal]
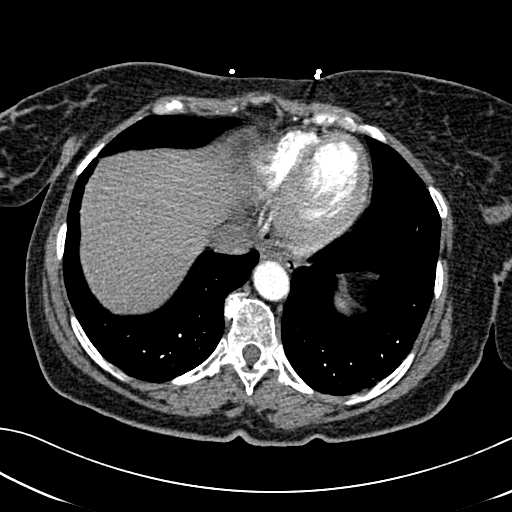
[im 67/239  lung]
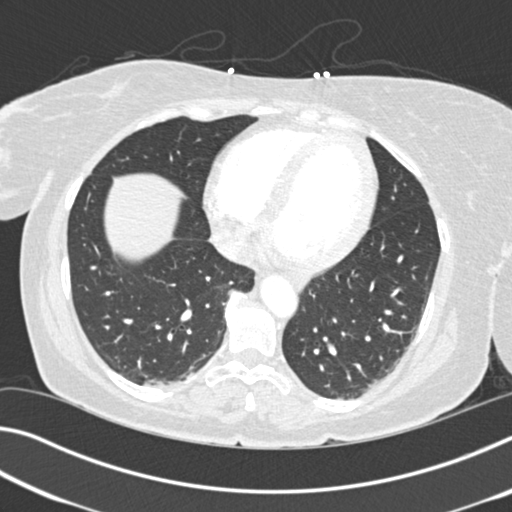
[im 80/239  mediastinal]
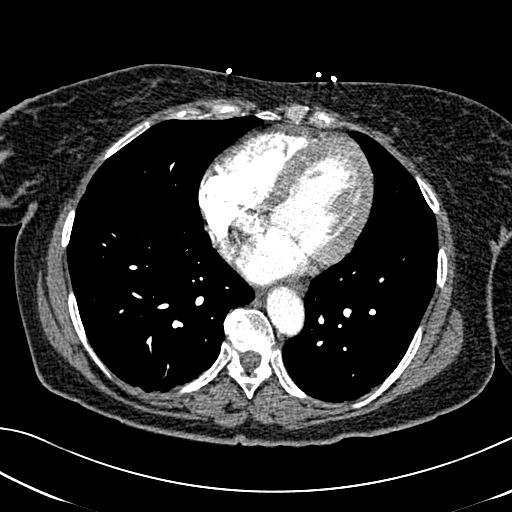
[im 93/239  lung]
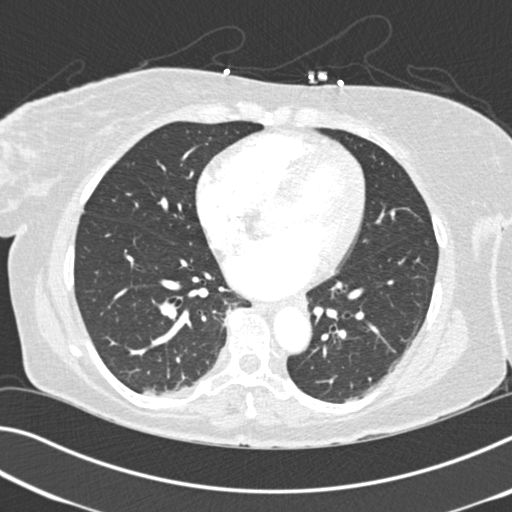
[im 106/239  mediastinal]
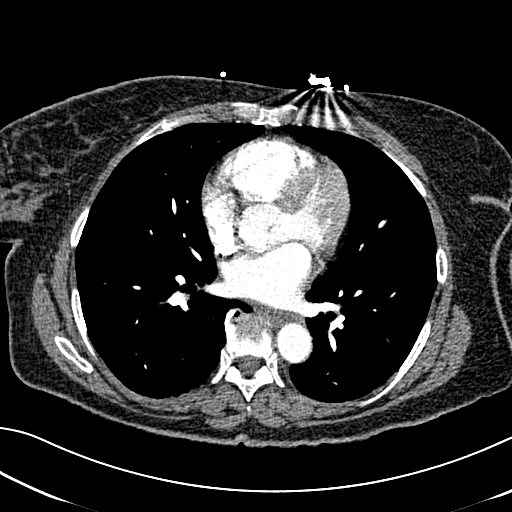
[im 120/239  lung]
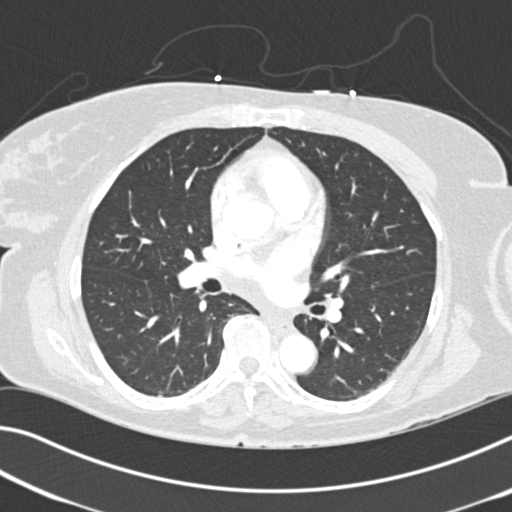
[im 133/239  mediastinal]
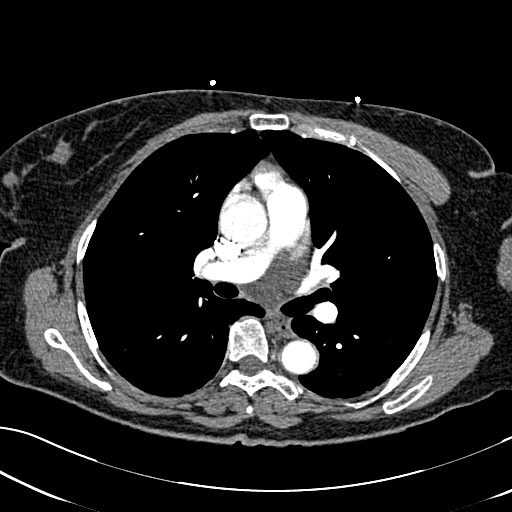
[im 146/239  lung]
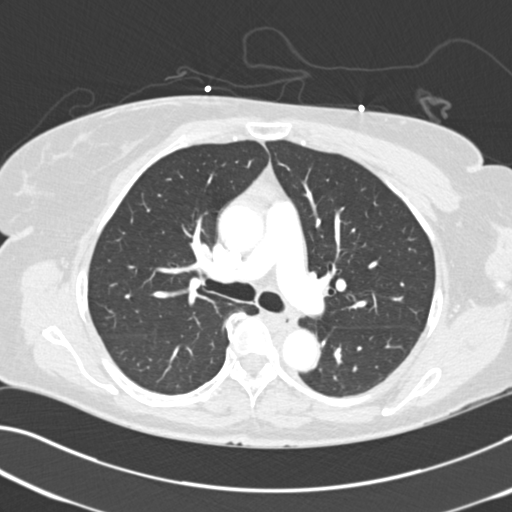
[im 159/239  mediastinal]
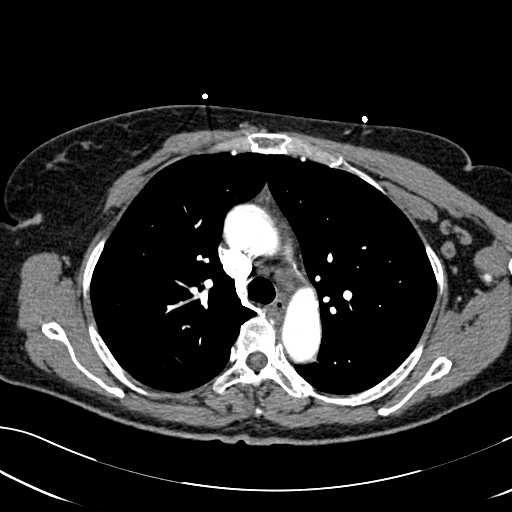
[im 172/239  lung]
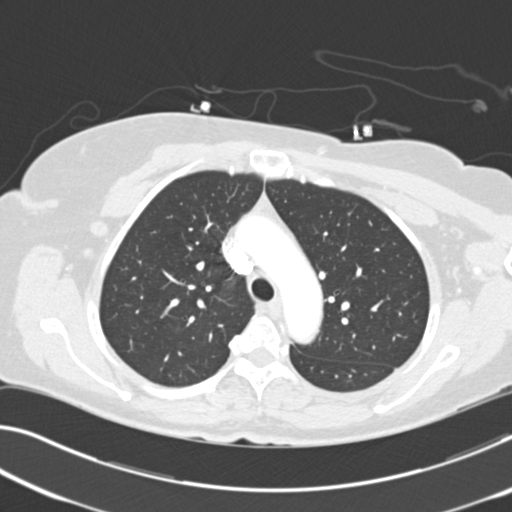
[im 186/239  mediastinal]
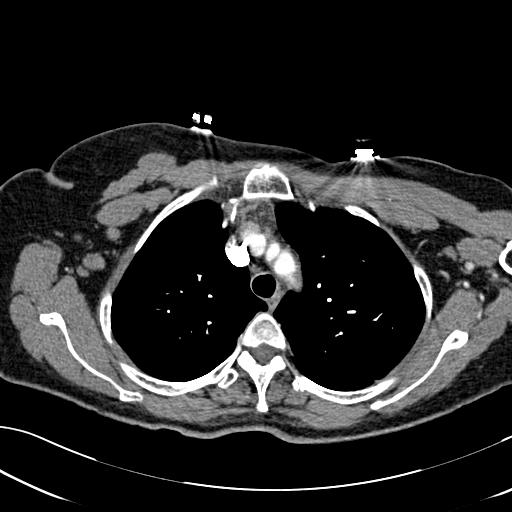
[im 199/239  lung]
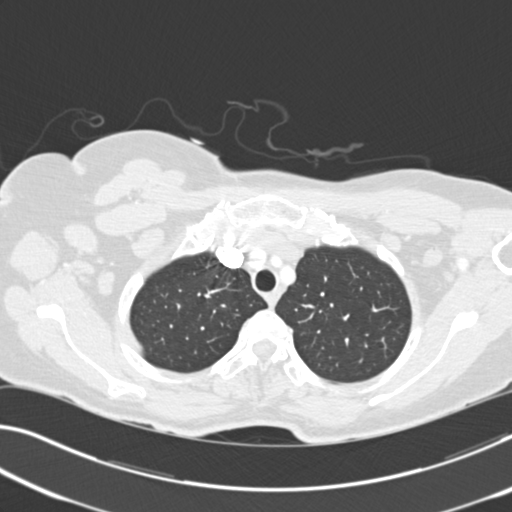
[im 212/239  mediastinal]
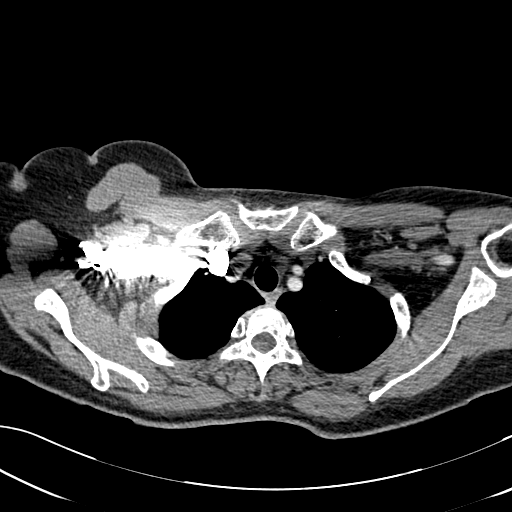
[im 225/239  lung]
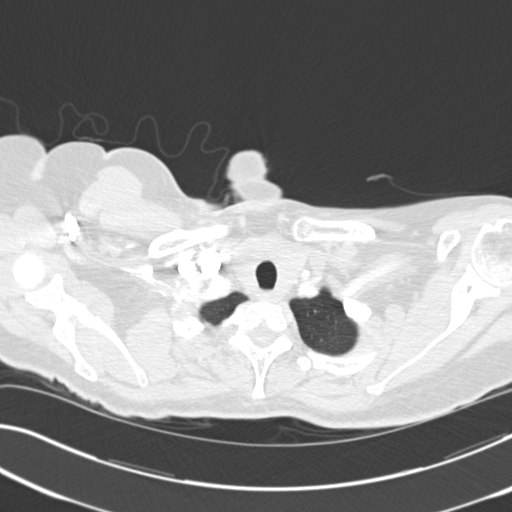

[Series 8: cor pe 2.0 mpr · coronal · 0.49mm/px · 1 of 115 slices shown]
[im 58/115  mediastinal]
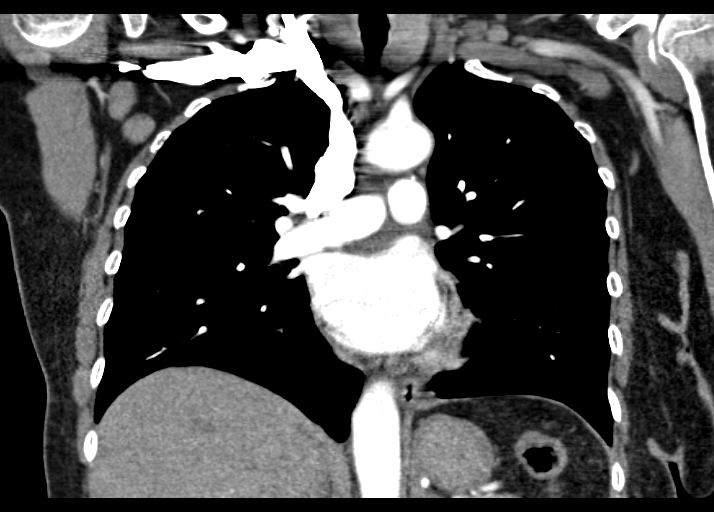

[18 of 36 positions shown; findings below may reference images not displayed]

FINDINGS: There is no demonstrable pulmonary embolus. There is no thoracic
aortic aneurysm or dissection.

There is underlying centrilobular emphysematous change with lower
lobe bronchiectasis bilaterally. There is subsegmental atelectasis
in both lung bases. There is no frank edema or consolidation.

There is no appreciable thoracic adenopathy. The pericardium is not
thickened.

In the visualized upper abdomen, there is fatty change in the liver.
There is an apparent hemangioma in the posterior segment of the
right lobe of the liver measuring 2.0 x 1.6 cm. Visualized upper
abdominal structures otherwise appear normal.

There is degenerative change in the thoracic spine. There are no
blastic or lytic bone lesions.

Review of the MIP images confirms the above findings.
IMPRESSION: No demonstrable pulmonary embolus.

Centrilobular pulmonary emphysema with bilateral lower lobe
bronchiectatic change. There is mild bibasilar atelectasis but no
consolidation.

Fatty liver with an apparent small hemangioma in the posterior
segment right lobe region.
